# Patient Record
Sex: Male | Born: 1990
Health system: Southern US, Community
[De-identification: ages and names within clinical notes are randomized; demographics above are authoritative.]

## PROBLEM LIST (undated history)

## (undated) DIAGNOSIS — S42309A Unspecified fracture of shaft of humerus, unspecified arm, initial encounter for closed fracture: Secondary | ICD-10-CM

## (undated) DIAGNOSIS — S42009A Fracture of unspecified part of unspecified clavicle, initial encounter for closed fracture: Secondary | ICD-10-CM

## (undated) DIAGNOSIS — Z202 Contact with and (suspected) exposure to infections with a predominantly sexual mode of transmission: Secondary | ICD-10-CM

## (undated) DIAGNOSIS — J3089 Other allergic rhinitis: Secondary | ICD-10-CM

## (undated) HISTORY — DX: Unspecified fracture of shaft of humerus, unspecified arm, initial encounter for closed fracture: S42.309A

## (undated) HISTORY — PX: FRACTURE SURGERY: SHX138

## (undated) HISTORY — DX: Contact with and (suspected) exposure to infections with a predominantly sexual mode of transmission: Z20.2

---
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
## 2010-08-11 ENCOUNTER — Inpatient Hospital Stay (INDEPENDENT_AMBULATORY_CARE_PROVIDER_SITE_OTHER)
Admission: RE | Admit: 2010-08-11 | Discharge: 2010-08-11 | Disposition: A | Discharge status: Home / Self Care | Payer: Self-pay | Source: Ambulatory Visit | Attending: Emergency Medicine | Admitting: Emergency Medicine

## 2010-08-11 DIAGNOSIS — N342 Other urethritis: Secondary | ICD-10-CM

## 2010-08-11 LAB — POCT URINALYSIS DIP (DEVICE)
Bilirubin Urine: NEGATIVE
Glucose, UA: NEGATIVE mg/dL
Ketones, ur: NEGATIVE mg/dL
Nitrite: NEGATIVE
Protein, ur: 30 mg/dL — AB
Specific Gravity, Urine: >=1.03 (ref 1.005–1.030)
Urobilinogen, UA: 1 mg/dL (ref 0.0–1.0)
pH: 6 (ref 5.0–8.0)

## 2010-09-28 ENCOUNTER — Ambulatory Visit (INDEPENDENT_AMBULATORY_CARE_PROVIDER_SITE_OTHER): Payer: Self-pay | Admitting: Family Medicine

## 2010-09-28 ENCOUNTER — Encounter: Payer: Self-pay | Admitting: Family Medicine

## 2010-09-28 VITALS — BP 130/74 | HR 71 | Ht 74.5 in | Wt 174.0 lb

## 2010-09-28 DIAGNOSIS — Z049 Encounter for examination and observation for unspecified reason: Secondary | ICD-10-CM

## 2010-09-28 DIAGNOSIS — IMO0001 Reserved for inherently not codable concepts without codable children: Secondary | ICD-10-CM

## 2010-10-11 ENCOUNTER — Encounter: Payer: Self-pay | Admitting: Family Medicine

## 2010-10-11 NOTE — Progress Notes (Signed)
  Subjective:    Patient ID: Cody Callahan, male    DOB: [birth year], 20 y.o.   MRN: [id number]  HPI Patient here to establish care so that he can get project access card, no complaints or concerns today.  Recently treated for GC/Chlamydia at [facility], no further symptoms.     Review of Systems Denies headaches, gi problems, shortness of breath, chest pain, muscle or joint aches, dysuria    Objective:   Physical Exam  Constitutional: He appears well-developed and well-nourished. No distress.  HENT:  Head: Normocephalic and atraumatic.  Cardiovascular: Normal rate, regular rhythm and normal heart sounds.  Exam reveals no gallop and no friction rub.   No murmur heard. Pulmonary/Chest: Effort normal and breath sounds normal.  Abdominal: Soft. Bowel sounds are normal. He exhibits no distension. There is no tenderness. There is no rebound.  Skin: Skin is warm and dry. No rash noted.  Psychiatric: He has a normal mood and affect. His behavior is normal. Judgment and thought content normal.          Assessment & Plan:  Well adult, explained to him he should return for complete physical once he has [facility] card.

## 2011-03-29 ENCOUNTER — Encounter: Payer: Self-pay | Admitting: Family Medicine

## 2011-03-29 ENCOUNTER — Ambulatory Visit (INDEPENDENT_AMBULATORY_CARE_PROVIDER_SITE_OTHER): Payer: Self-pay | Admitting: Family Medicine

## 2011-03-29 DIAGNOSIS — J019 Acute sinusitis, unspecified: Secondary | ICD-10-CM | POA: Insufficient documentation

## 2011-03-29 MED ORDER — AMOXICILLIN 500 MG PO CAPS: 1000.0000 mg | ORAL_CAPSULE | Freq: Three times a day (TID) | ORAL | Status: AC

## 2011-03-29 NOTE — Patient Instructions (Addendum)
It was good seeing you today Take your antibiotics until they are all gone. You can continue OTC decongestants and cough suppressants.  Try mucinex to thin secretions.     You will likely start to feel better within a few days Call back if you feel like your symptoms are not improving.

## 2011-03-29 NOTE — Progress Notes (Signed)
  Subjective:    Patient ID: Cody Callahan, male    DOB: Jul 27, 1990, 20 y.o.   MRN: [id number]  Sinusitis Episode onset: ~2 weeks ago with worsening over the past 2 days.  Maximum temperature: Subjective fever without chills at home. The fever has been present for 1 to 2 days. The pain is moderate. Pertinent negatives include no chills or shortness of breath. (Symptoms include: Cough, congestion, headache, sinus pressure R>L, sore throat Tooth pain, Purulent nasal discharge with blood when blowing nose. ) Past treatments include oral decongestants and acetaminophen. The treatment provided mild relief.      Review of Systems  Constitutional: Negative for chills.  Respiratory: Negative for chest tightness, shortness of breath and wheezing.   Cardiovascular: Negative for chest pain.  Musculoskeletal: Negative for myalgias.       Objective:   Physical Exam  Constitutional: He appears well-developed and well-nourished. No distress.  HENT:  Head: Normocephalic and atraumatic.  Right Ear: Tympanic membrane and ear canal normal.  Left Ear: Tympanic membrane and ear canal normal.  Nose: Right sinus exhibits maxillary sinus tenderness and frontal sinus tenderness. Left sinus exhibits maxillary sinus tenderness. Left sinus exhibits no frontal sinus tenderness.  Mouth/Throat: Uvula is midline. Posterior oropharyngeal erythema present. No oropharyngeal exudate.  Cardiovascular: Normal rate, regular rhythm and normal heart sounds.   Pulmonary/Chest: Effort normal and breath sounds normal.  Lymphadenopathy:    He has no cervical adenopathy.  Neurological: He is alert.  Skin: Skin is warm and dry.          Assessment & Plan:

## 2011-06-23 ENCOUNTER — Emergency Department (INDEPENDENT_AMBULATORY_CARE_PROVIDER_SITE_OTHER): Payer: Self-pay

## 2011-06-23 ENCOUNTER — Encounter (HOSPITAL_COMMUNITY): Payer: Self-pay

## 2011-06-23 ENCOUNTER — Emergency Department (HOSPITAL_COMMUNITY)
Admission: EM | Admit: 2011-06-23 | Discharge: 2011-06-23 | Disposition: A | Discharge status: Home / Self Care | Payer: Self-pay | Source: Home / Self Care

## 2011-06-23 DIAGNOSIS — S62309A Unspecified fracture of unspecified metacarpal bone, initial encounter for closed fracture: Secondary | ICD-10-CM

## 2011-06-23 DIAGNOSIS — S62306A Unspecified fracture of fifth metacarpal bone, right hand, initial encounter for closed fracture: Secondary | ICD-10-CM

## 2011-06-23 NOTE — Progress Notes (Signed)
Orthopedic Tech Progress Note Patient Details:  Cody Callahan 1990-05-06 [id number]  Type of Splint: Other (comment) (ulnar gutter) Splint Location: right hand Splint Interventions: Application    Nikki Dom 06/23/2011, 3:37 PM
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
## 2011-06-23 NOTE — ED Provider Notes (Signed)
Medical screening examination/treatment/procedure(s) were performed by resident physician or non-physician practitioner and as supervising physician I was immediately available for consultation/collaboration.   Adeleine Pask DOUGLAS MD.    Sonika Levins D Sui Kasparek, MD 06/23/11 1936 
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
## 2011-06-23 NOTE — Discharge Instructions (Signed)
Wear the splint until you are seen by Dr Amanda Pea for follow up. Ice and elevate your hand to help with swelling. Tylenol or Ibuprofen as needed for discomfort.

## 2014-11-10 ENCOUNTER — Ambulatory Visit
Admission: RE | Admit: 2014-11-10 | Discharge: 2014-11-10 | Disposition: A | Discharge status: Home / Self Care | Payer: No Typology Code available for payment source | Source: Ambulatory Visit | Attending: Occupational Medicine | Admitting: Occupational Medicine

## 2014-11-10 ENCOUNTER — Other Ambulatory Visit: Payer: Self-pay | Admitting: Occupational Medicine

## 2014-11-10 DIAGNOSIS — Z021 Encounter for pre-employment examination: Secondary | ICD-10-CM

## 2017-08-14 DIAGNOSIS — D1801 Hemangioma of skin and subcutaneous tissue: Secondary | ICD-10-CM | POA: Diagnosis not present

## 2017-08-14 DIAGNOSIS — L814 Other melanin hyperpigmentation: Secondary | ICD-10-CM | POA: Diagnosis not present

## 2017-08-14 DIAGNOSIS — D225 Melanocytic nevi of trunk: Secondary | ICD-10-CM | POA: Diagnosis not present

## 2017-08-29 DIAGNOSIS — M9901 Segmental and somatic dysfunction of cervical region: Secondary | ICD-10-CM | POA: Diagnosis not present

## 2017-08-29 DIAGNOSIS — M542 Cervicalgia: Secondary | ICD-10-CM | POA: Diagnosis not present

## 2017-08-29 DIAGNOSIS — M9903 Segmental and somatic dysfunction of lumbar region: Secondary | ICD-10-CM | POA: Diagnosis not present

## 2017-09-04 DIAGNOSIS — M9901 Segmental and somatic dysfunction of cervical region: Secondary | ICD-10-CM | POA: Diagnosis not present

## 2017-09-04 DIAGNOSIS — M542 Cervicalgia: Secondary | ICD-10-CM | POA: Diagnosis not present

## 2017-09-04 DIAGNOSIS — M9903 Segmental and somatic dysfunction of lumbar region: Secondary | ICD-10-CM | POA: Diagnosis not present

## 2017-09-11 DIAGNOSIS — M542 Cervicalgia: Secondary | ICD-10-CM | POA: Diagnosis not present

## 2017-09-11 DIAGNOSIS — M9901 Segmental and somatic dysfunction of cervical region: Secondary | ICD-10-CM | POA: Diagnosis not present

## 2017-09-11 DIAGNOSIS — M9903 Segmental and somatic dysfunction of lumbar region: Secondary | ICD-10-CM | POA: Diagnosis not present

## 2017-09-13 DIAGNOSIS — M9903 Segmental and somatic dysfunction of lumbar region: Secondary | ICD-10-CM | POA: Diagnosis not present

## 2017-09-13 DIAGNOSIS — M9901 Segmental and somatic dysfunction of cervical region: Secondary | ICD-10-CM | POA: Diagnosis not present

## 2017-09-13 DIAGNOSIS — M542 Cervicalgia: Secondary | ICD-10-CM | POA: Diagnosis not present

## 2017-09-19 DIAGNOSIS — M542 Cervicalgia: Secondary | ICD-10-CM | POA: Diagnosis not present

## 2017-09-19 DIAGNOSIS — M9901 Segmental and somatic dysfunction of cervical region: Secondary | ICD-10-CM | POA: Diagnosis not present

## 2017-09-19 DIAGNOSIS — M9903 Segmental and somatic dysfunction of lumbar region: Secondary | ICD-10-CM | POA: Diagnosis not present

## 2017-10-01 DIAGNOSIS — M9901 Segmental and somatic dysfunction of cervical region: Secondary | ICD-10-CM | POA: Diagnosis not present

## 2017-10-01 DIAGNOSIS — M9903 Segmental and somatic dysfunction of lumbar region: Secondary | ICD-10-CM | POA: Diagnosis not present

## 2017-10-01 DIAGNOSIS — M542 Cervicalgia: Secondary | ICD-10-CM | POA: Diagnosis not present

## 2017-12-28 ENCOUNTER — Emergency Department (HOSPITAL_COMMUNITY)
Admission: EM | Admit: 2017-12-28 | Discharge: 2017-12-28 | Disposition: A | Discharge status: Home / Self Care | Payer: 59 | Attending: Emergency Medicine | Admitting: Emergency Medicine

## 2017-12-28 ENCOUNTER — Encounter (HOSPITAL_COMMUNITY): Payer: Self-pay | Admitting: Emergency Medicine

## 2017-12-28 ENCOUNTER — Emergency Department (HOSPITAL_COMMUNITY): Payer: 59

## 2017-12-28 DIAGNOSIS — Y9389 Activity, other specified: Secondary | ICD-10-CM | POA: Insufficient documentation

## 2017-12-28 DIAGNOSIS — Y92410 Unspecified street and highway as the place of occurrence of the external cause: Secondary | ICD-10-CM | POA: Diagnosis not present

## 2017-12-28 DIAGNOSIS — T07XXXA Unspecified multiple injuries, initial encounter: Secondary | ICD-10-CM | POA: Insufficient documentation

## 2017-12-28 DIAGNOSIS — S80812A Abrasion, left lower leg, initial encounter: Secondary | ICD-10-CM | POA: Diagnosis not present

## 2017-12-28 DIAGNOSIS — S4992XA Unspecified injury of left shoulder and upper arm, initial encounter: Secondary | ICD-10-CM | POA: Diagnosis present

## 2017-12-28 DIAGNOSIS — S42002A Fracture of unspecified part of left clavicle, initial encounter for closed fracture: Secondary | ICD-10-CM | POA: Diagnosis not present

## 2017-12-28 DIAGNOSIS — S50812A Abrasion of left forearm, initial encounter: Secondary | ICD-10-CM | POA: Diagnosis not present

## 2017-12-28 DIAGNOSIS — S299XXA Unspecified injury of thorax, initial encounter: Secondary | ICD-10-CM | POA: Diagnosis not present

## 2017-12-28 DIAGNOSIS — T148XXA Other injury of unspecified body region, initial encounter: Secondary | ICD-10-CM

## 2017-12-28 DIAGNOSIS — Y999 Unspecified external cause status: Secondary | ICD-10-CM | POA: Insufficient documentation

## 2017-12-28 DIAGNOSIS — S42022A Displaced fracture of shaft of left clavicle, initial encounter for closed fracture: Secondary | ICD-10-CM

## 2017-12-28 MED ORDER — OXYCODONE-ACETAMINOPHEN 5-325 MG PO TABS: 1.0000 | ORAL_TABLET | ORAL | 0 refills | Status: DC | PRN

## 2017-12-28 MED ORDER — OXYCODONE-ACETAMINOPHEN 5-325 MG PO TABS
2.0000 | ORAL_TABLET | Freq: Once | ORAL | Status: AC
Start: 2017-12-28 — End: 2017-12-28
  Administered 2017-12-28: 2 via ORAL
  Filled 2017-12-28: qty 2

## 2017-12-28 MED ORDER — MORPHINE SULFATE (PF) 4 MG/ML IV SOLN
4.0000 mg | Freq: Once | INTRAVENOUS | Status: AC
  Administered 2017-12-28: 4 mg via INTRAVENOUS
  Filled 2017-12-28: qty 1
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
## 2017-12-30 DIAGNOSIS — S42002A Fracture of unspecified part of left clavicle, initial encounter for closed fracture: Secondary | ICD-10-CM | POA: Diagnosis not present

## 2018-01-01 ENCOUNTER — Other Ambulatory Visit: Payer: Self-pay

## 2018-01-01 ENCOUNTER — Encounter (HOSPITAL_COMMUNITY): Payer: Self-pay | Admitting: *Deleted

## 2018-01-01 NOTE — Progress Notes (Signed)
Pt denies SOB, chest pain, and being under the care of a cardiologist. 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 Pt denies having a 12-lead EKG and chest x ray within the last year. Pt denies recent labs. Pt made aware to stop taking vitamins, fish oil and herbal medications. Do not take any NSAIDs ie: Ibuprofen, Advil, Naproxen (Aleve), Motrin, BC and Goody Powder. Pt verbalized understanding of all pre-op instructions.

## 2018-01-02 ENCOUNTER — Ambulatory Visit (HOSPITAL_COMMUNITY): Payer: 59

## 2018-01-02 ENCOUNTER — Ambulatory Visit (HOSPITAL_COMMUNITY): Payer: 59 | Admitting: Anesthesiology

## 2018-01-02 ENCOUNTER — Ambulatory Visit (HOSPITAL_COMMUNITY)
Admission: RE | Admit: 2018-01-02 | Discharge: 2018-01-02 | Disposition: A | Discharge status: Home / Self Care | Payer: 59 | Source: Ambulatory Visit | Attending: Orthopedic Surgery | Admitting: Orthopedic Surgery

## 2018-01-02 ENCOUNTER — Encounter (HOSPITAL_COMMUNITY): Payer: Self-pay | Admitting: Urology

## 2018-01-02 ENCOUNTER — Encounter (HOSPITAL_COMMUNITY)
Admission: RE | Disposition: A | Discharge status: Home / Self Care | Payer: Self-pay | Source: Ambulatory Visit | Attending: Orthopedic Surgery

## 2018-01-02 DIAGNOSIS — Z8249 Family history of ischemic heart disease and other diseases of the circulatory system: Secondary | ICD-10-CM | POA: Insufficient documentation

## 2018-01-02 DIAGNOSIS — Z803 Family history of malignant neoplasm of breast: Secondary | ICD-10-CM | POA: Insufficient documentation

## 2018-01-02 DIAGNOSIS — S42022A Displaced fracture of shaft of left clavicle, initial encounter for closed fracture: Secondary | ICD-10-CM | POA: Diagnosis not present

## 2018-01-02 DIAGNOSIS — Z833 Family history of diabetes mellitus: Secondary | ICD-10-CM | POA: Insufficient documentation

## 2018-01-02 DIAGNOSIS — Z79899 Other long term (current) drug therapy: Secondary | ICD-10-CM | POA: Insufficient documentation

## 2018-01-02 DIAGNOSIS — Z87891 Personal history of nicotine dependence: Secondary | ICD-10-CM | POA: Insufficient documentation

## 2018-01-02 DIAGNOSIS — Z8379 Family history of other diseases of the digestive system: Secondary | ICD-10-CM | POA: Diagnosis not present

## 2018-01-02 DIAGNOSIS — S42002D Fracture of unspecified part of left clavicle, subsequent encounter for fracture with routine healing: Secondary | ICD-10-CM | POA: Diagnosis not present

## 2018-01-02 HISTORY — DX: Other allergic rhinitis: J30.89

## 2018-01-02 HISTORY — PX: ORIF CLAVICULAR FRACTURE: SHX5055

## 2018-01-02 HISTORY — DX: Fracture of unspecified part of unspecified clavicle, initial encounter for closed fracture: S42.009A

## 2018-01-02 SURGERY — OPEN REDUCTION INTERNAL FIXATION (ORIF) CLAVICULAR FRACTURE
Anesthesia: General | Site: Shoulder | Laterality: Left

## 2018-01-02 MED ORDER — DEXAMETHASONE SODIUM PHOSPHATE 10 MG/ML IJ SOLN
INTRAMUSCULAR | Status: AC
  Filled 2018-01-02: qty 5

## 2018-01-02 MED ORDER — MIDAZOLAM HCL 5 MG/5ML IJ SOLN
INTRAMUSCULAR | Status: DC | PRN
  Administered 2018-01-02: 2 mg via INTRAVENOUS

## 2018-01-02 MED ORDER — FENTANYL CITRATE (PF) 100 MCG/2ML IJ SOLN
INTRAMUSCULAR | Status: AC
  Administered 2018-01-02: 50 ug via INTRAVENOUS
  Filled 2018-01-02: qty 2

## 2018-01-02 MED ORDER — LACTATED RINGERS IV SOLN: INTRAVENOUS | Status: DC

## 2018-01-02 MED ORDER — ONDANSETRON HCL 4 MG/2ML IJ SOLN
INTRAMUSCULAR | Status: AC
  Filled 2018-01-02: qty 8

## 2018-01-02 MED ORDER — OXYCODONE-ACETAMINOPHEN 5-325 MG PO TABS: 1.0000 | ORAL_TABLET | ORAL | 0 refills | Status: DC | PRN

## 2018-01-02 MED ORDER — PROPOFOL 10 MG/ML IV BOLUS
INTRAVENOUS | Status: AC
  Filled 2018-01-02: qty 20

## 2018-01-02 MED ORDER — HYDROMORPHONE HCL 1 MG/ML IJ SOLN: 0.2500 mg | INTRAMUSCULAR | Status: DC | PRN

## 2018-01-02 MED ORDER — MIDAZOLAM HCL 2 MG/2ML IJ SOLN
INTRAMUSCULAR | Status: AC
  Filled 2018-01-02: qty 2

## 2018-01-02 MED ORDER — FENTANYL CITRATE (PF) 100 MCG/2ML IJ SOLN
50.0000 ug | Freq: Once | INTRAMUSCULAR | Status: AC
  Administered 2018-01-02: 50 ug via INTRAVENOUS
  Filled 2018-01-02: qty 1

## 2018-01-02 MED ORDER — PROPOFOL 10 MG/ML IV BOLUS
INTRAVENOUS | Status: DC | PRN
  Administered 2018-01-02: 150 mg via INTRAVENOUS

## 2018-01-02 MED ORDER — ONDANSETRON HCL 4 MG/2ML IJ SOLN
INTRAMUSCULAR | Status: DC | PRN
  Administered 2018-01-02: 4 mg via INTRAVENOUS

## 2018-01-02 MED ORDER — BUPIVACAINE-EPINEPHRINE (PF) 0.5% -1:200000 IJ SOLN
INTRAMUSCULAR | Status: DC | PRN
  Administered 2018-01-02: 15 mL via PERINEURAL

## 2018-01-02 MED ORDER — PROMETHAZINE HCL 25 MG/ML IJ SOLN: 6.2500 mg | INTRAMUSCULAR | Status: DC | PRN

## 2018-01-02 MED ORDER — PHENYLEPHRINE 40 MCG/ML (10ML) SYRINGE FOR IV PUSH (FOR BLOOD PRESSURE SUPPORT)
PREFILLED_SYRINGE | INTRAVENOUS | Status: AC
  Filled 2018-01-02: qty 40

## 2018-01-02 MED ORDER — MIDAZOLAM HCL 2 MG/2ML IJ SOLN
INTRAMUSCULAR | Status: AC
  Administered 2018-01-02: 2 mg via INTRAVENOUS
  Filled 2018-01-02: qty 2

## 2018-01-02 MED ORDER — MEPERIDINE HCL 50 MG/ML IJ SOLN: 6.2500 mg | INTRAMUSCULAR | Status: DC | PRN

## 2018-01-02 MED ORDER — CHLORHEXIDINE GLUCONATE 4 % EX LIQD: 60.0000 mL | Freq: Once | CUTANEOUS | Status: DC

## 2018-01-02 MED ORDER — EPHEDRINE 5 MG/ML INJ
INTRAVENOUS | Status: AC
  Filled 2018-01-02: qty 10

## 2018-01-02 MED ORDER — CEFAZOLIN SODIUM-DEXTROSE 2-4 GM/100ML-% IV SOLN
2.0000 g | INTRAVENOUS | Status: AC
  Administered 2018-01-02: 2 g via INTRAVENOUS
  Filled 2018-01-02: qty 100

## 2018-01-02 MED ORDER — LIDOCAINE 2% (20 MG/ML) 5 ML SYRINGE
INTRAMUSCULAR | Status: AC
  Filled 2018-01-02: qty 20

## 2018-01-02 MED ORDER — ROCURONIUM BROMIDE 100 MG/10ML IV SOLN
INTRAVENOUS | Status: DC | PRN
  Administered 2018-01-02: 50 mg via INTRAVENOUS

## 2018-01-02 MED ORDER — FENTANYL CITRATE (PF) 250 MCG/5ML IJ SOLN
INTRAMUSCULAR | Status: AC
  Filled 2018-01-02: qty 5

## 2018-01-02 MED ORDER — SUGAMMADEX SODIUM 200 MG/2ML IV SOLN
INTRAVENOUS | Status: DC | PRN
  Administered 2018-01-02: 300 mg via INTRAVENOUS

## 2018-01-02 MED ORDER — ONDANSETRON HCL 4 MG PO TABS: 4.0000 mg | ORAL_TABLET | Freq: Three times a day (TID) | ORAL | 0 refills | Status: DC | PRN

## 2018-01-02 MED ORDER — CYCLOBENZAPRINE HCL 10 MG PO TABS: 10.0000 mg | ORAL_TABLET | Freq: Three times a day (TID) | ORAL | 1 refills | Status: DC | PRN

## 2018-01-02 MED ORDER — BUPIVACAINE-EPINEPHRINE 0.5% -1:200000 IJ SOLN
INTRAMUSCULAR | Status: AC
  Filled 2018-01-02: qty 1

## 2018-01-02 MED ORDER — MIDAZOLAM HCL 2 MG/2ML IJ SOLN
2.0000 mg | Freq: Once | INTRAMUSCULAR | Status: AC
  Administered 2018-01-02: 2 mg via INTRAVENOUS
  Filled 2018-01-02: qty 2

## 2018-01-02 MED ORDER — SUCCINYLCHOLINE CHLORIDE 200 MG/10ML IV SOSY
PREFILLED_SYRINGE | INTRAVENOUS | Status: AC
  Filled 2018-01-02: qty 10

## 2018-01-02 MED ORDER — FENTANYL CITRATE (PF) 100 MCG/2ML IJ SOLN
INTRAMUSCULAR | Status: DC | PRN
  Administered 2018-01-02: 150 ug via INTRAVENOUS
  Administered 2018-01-02: 100 ug via INTRAVENOUS

## 2018-01-02 MED ORDER — 0.9 % SODIUM CHLORIDE (POUR BTL) OPTIME
TOPICAL | Status: DC | PRN
  Administered 2018-01-02: 1000 mL

## 2018-01-02 MED ORDER — ROCURONIUM BROMIDE 50 MG/5ML IV SOSY
PREFILLED_SYRINGE | INTRAVENOUS | Status: AC
  Filled 2018-01-02: qty 15

## 2018-01-02 MED ORDER — LACTATED RINGERS IV SOLN
INTRAVENOUS | Status: DC
  Administered 2018-01-02 (×3): via INTRAVENOUS

## 2018-01-02 MED ORDER — LIDOCAINE HCL (CARDIAC) PF 100 MG/5ML IV SOSY
PREFILLED_SYRINGE | INTRAVENOUS | Status: DC | PRN
  Administered 2018-01-02: 100 mg via INTRAVENOUS

## 2018-01-02 SURGICAL SUPPLY — 54 items
ADH SKN CLS APL DERMABOND .7 (GAUZE/BANDAGES/DRESSINGS) ×1
AID PSTN UNV HD RSTRNT DISP (MISCELLANEOUS) ×1
BIT DRILL 2.8X5 QR DISP (BIT) ×1 IMPLANT
COVER SURGICAL LIGHT HANDLE (MISCELLANEOUS) ×2 IMPLANT
DERMABOND ADVANCED (GAUZE/BANDAGES/DRESSINGS) ×1
DERMABOND ADVANCED .7 DNX12 (GAUZE/BANDAGES/DRESSINGS) ×1 IMPLANT
DRAPE C-ARM 42X72 X-RAY (DRAPES) ×2 IMPLANT
DRAPE ORTHO SPLIT 77X108 STRL (DRAPES) ×4
DRAPE SURG 17X23 STRL (DRAPES) ×1 IMPLANT
DRAPE SURG ORHT 6 SPLT 77X108 (DRAPES) ×2 IMPLANT
DRAPE U-SHAPE 47X51 STRL (DRAPES) ×4 IMPLANT
DRSG MEPILEX BORDER 4X8 (GAUZE/BANDAGES/DRESSINGS) ×2 IMPLANT
DURAPREP 26ML APPLICATOR (WOUND CARE) ×2 IMPLANT
ELECT CAUTERY BLADE 6.4 (BLADE) ×2 IMPLANT
ELECT REM PT RETURN 9FT ADLT (ELECTROSURGICAL) ×2
ELECTRODE REM PT RTRN 9FT ADLT (ELECTROSURGICAL) ×1 IMPLANT
GLOVE BIO SURGEON STRL SZ7.5 (GLOVE) ×2 IMPLANT
GLOVE BIO SURGEON STRL SZ8 (GLOVE) ×3 IMPLANT
GLOVE EUDERMIC 7 POWDERFREE (GLOVE) ×2 IMPLANT
GLOVE SS BIOGEL STRL SZ 7.5 (GLOVE) ×1 IMPLANT
GLOVE SUPERSENSE BIOGEL SZ 7.5 (GLOVE) ×2
GOWN STRL REUS W/ TWL LRG LVL3 (GOWN DISPOSABLE) ×1 IMPLANT
GOWN STRL REUS W/ TWL XL LVL3 (GOWN DISPOSABLE) ×2 IMPLANT
GOWN STRL REUS W/TWL LRG LVL3 (GOWN DISPOSABLE) ×4
GOWN STRL REUS W/TWL XL LVL3 (GOWN DISPOSABLE) ×4
KIT BASIN OR (CUSTOM PROCEDURE TRAY) ×2 IMPLANT
KIT TURNOVER KIT B (KITS) ×2 IMPLANT
MANIFOLD NEPTUNE II (INSTRUMENTS) ×2 IMPLANT
NDL HYPO 25GX1X1/2 BEV (NEEDLE) ×1 IMPLANT
NEEDLE HYPO 25GX1X1/2 BEV (NEEDLE) ×2 IMPLANT
NS IRRIG 1000ML POUR BTL (IV SOLUTION) ×2 IMPLANT
PACK SHOULDER (CUSTOM PROCEDURE TRAY) ×2 IMPLANT
PAD ARMBOARD 7.5X6 YLW CONV (MISCELLANEOUS) ×3 IMPLANT
PLATE CLAVICLE 8 HOLE LEFT (Plate) ×1 IMPLANT
RESTRAINT HEAD UNIVERSAL NS (MISCELLANEOUS) ×2 IMPLANT
SCREW LOCK 12X3.5X HEXALOBE (Screw) IMPLANT
SCREW LOCKING 3.5X10MM (Screw) ×2 IMPLANT
SCREW LOCKING 3.5X12 (Screw) ×4 IMPLANT
SCREW NON LOCK 3.5X10MM (Screw) ×1 IMPLANT
SCREW NONLOCK HEX 3.5X12 (Screw) ×1 IMPLANT
SLING ARM FOAM STRAP LRG (SOFTGOODS) ×1 IMPLANT
SLING ARM IMMOBILIZER XL (CAST SUPPLIES) ×1 IMPLANT
SPONGE LAP 18X18 X RAY DECT (DISPOSABLE) ×4 IMPLANT
SPONGE LAP 4X18 RFD (DISPOSABLE) ×3 IMPLANT
SUCTION FRAZIER HANDLE 10FR (MISCELLANEOUS) ×1
SUCTION TUBE FRAZIER 10FR DISP (MISCELLANEOUS) ×1 IMPLANT
SUT MNCRL AB 3-0 PS2 18 (SUTURE) ×2 IMPLANT
SUT MON AB 2-0 CT1 27 (SUTURE) ×2 IMPLANT
SUT VIC AB 1 CT1 27 (SUTURE) ×4
SUT VIC AB 1 CT1 27XBRD ANBCTR (SUTURE) ×1 IMPLANT
SYR CONTROL 10ML LL (SYRINGE) ×2 IMPLANT
TOWEL OR 17X24 6PK STRL BLUE (TOWEL DISPOSABLE) ×1 IMPLANT
TOWEL OR 17X26 10 PK STRL BLUE (TOWEL DISPOSABLE) ×2 IMPLANT
YANKAUER SUCT BULB TIP NO VENT (SUCTIONS) ×2 IMPLANT

## 2018-01-02 NOTE — Discharge Instructions (Addendum)
Cody Callahan. Supple, M.D., F.A.A.O.S. Orthopaedic Surgery   Clavicle Fracture A clavicle fracture is a broken collarbone. The collarbone is the long bone that connects your shoulder to your chest wall. A broken collarbone may be treated with a sling or with surgery. Treatment depends on whether the broken ends of the bone are out of place or not. Follow these instructions at home: If you have a sling:  Wear the sling as told by your doctor. Take it off only as told by your doctor.  Loosen the sling if your fingers tingle, become numb, or turn cold and blue.  Do not lift your arm. Keep it across your chest.  Keep the sling clean.  Ask your doctor if you may take off the sling for bathing. ? If your sling is not waterproof, do not let it get wet. Cover the sling with a watertight covering if you take a bath or a shower while wearing it. ? If you may take off your sling when you take a bath or a shower, keep your shoulder in the same position as when the sling is on. Managing pain, stiffness, and swelling  If told, put ice on the injured area: ? If you have a removable sling, take it off as told by your doctor. ? Put ice in a plastic bag. ? Place a towel between your skin and the bag. ? Leave the ice on for 20 minutes, 2-3 times a day. Activity  Avoid activities that make your symptoms worse for 4-6 weeks, or as long as told.  Ask your doctor when it is safe for you to drive.  Do exercises as told by your doctor. General instructions  Do not use any products that contain nicotine or tobacco, such as cigarettes and e-cigarettes. These can delay bone healing. If you need help quitting, ask your doctor.  Take over-the-counter and prescription medicines only as told by your doctor.  Keep all follow-up visits as told by your doctor. This is important. Contact a doctor if:  Your medicine is not making you feel less pain.  Your medicine is not making swelling better. Get help  right away if:  Your cannot feel your arm (your arm is numb).  Your arm is cold.  Your arm is a lighter color than normal. Summary  A clavicle fracture is a broken collarbone. The collarbone is the long bone that connects your shoulder to your chest wall.  Treatment depends on whether the broken ends of the bone are out of place or not.  If you have a sling, wear it as told by your doctor.  Do exercises when your doctor says you can. The exercises will help your arm get strong and move like it used to. This information is not intended to replace advice given to you by your health care provider. Make sure you discuss any questions you have with your health care provider. Document Released: 09/12/2007 Document Revised: 02/13/2016 Document Reviewed: 02/13/2016 Elsevier Interactive Patient Education  2017 [facility] Arthroscopic [facility] Reconstructive Surgery of the Shoulder and Knee [phone] [address] [place], [place] [address] - Fax [phone]   POST-OP  INSTRUCTIONS  1. Call the office at [phone] to schedule your first post-op appointment 7-10 days from the date of your surgery.  2. You may remove the current dressing on day 3 and shower. Leave the dermabond(clear glue like substance) across incision.   3. Wear your sling/immobilizer at all times except  to perform the exercises below or to occasionally let your arm dangle by your side to stretch your elbow. You also need to sleep in your sling immobilizer until instructed otherwise.  4. Range of motion to your elbow, wrist, and hand are encouraged 3-5 times daily. Exercise to your hand and fingers helps to reduce swelling you may experience.  5. Utilize ice to the shoulder 3-5 times minimum a day and additionally if you are experiencing pain.  6. Prescriptions for a pain medication and a muscle relaxant are provided for you. It is recommended that if you are experiencing pain that you  pain medication alone is not controlling, add the muscle relaxant along with the pain medication which can give additional pain relief. The first 1-2 days is generally the most severe of your pain and then should gradually decrease. As your pain lessens it is recommended that you decrease your use of the pain medications to an "as needed basis'" only and to always comply with the recommended dosages of the pain medications.  7. Pain medications can produce constipation along with their use. If you experience this, the use of an over the counter stool softener or laxative daily is recommended.   8. For additional questions or concerns, please do not hesitate to call the office. If after hours there is an answering service to forward your concerns to the physician on call.  9.Pain control following an exparel block  To help control your post-operative pain you received a nerve block  performed with Exparel which is a long acting anesthetic (numbing agent) which can provide pain relief and sensations of numbness (and relief of pain) in the operative shoulder and arm for up to 3 days. Sometimes it provides mixed relief, meaning you may still have numbness in certain areas of the arm but can still be able to move  parts of that arm, hand, and fingers. We recommend that your prescribed pain medications  be used as needed. We do not feel it is necessary to "pre medicate" and "stay ahead" of pain.  Taking narcotic pain medications when you are not having any pain can lead to unnecessary and potentially dangerous side effects.   POST-OP EXERCISES  Pendulum Exercises  Perform pendulum exercises while standing and bending at the waist. Support your uninvolved arm on a table or chair and allow your operated arm to hang freely. Make sure to do these exercises passively - not using you shoulder muscles.  Repeat 20 times. Do 3 sessions per day.

## 2018-01-02 NOTE — Anesthesia Procedure Notes (Signed)
Procedure Name: Intubation Date/Time: 01/02/2018 3:49 PM Performed by: Lizmarie Witters T, CRNA Pre-anesthesia Checklist: Patient identified, Emergency Drugs available, Suction available and Patient being monitored Patient Re-evaluated:Patient Re-evaluated prior to induction Oxygen Delivery Method: Circle system utilized Preoxygenation: Pre-oxygenation with 100% oxygen Induction Type: IV induction Ventilation: Mask ventilation without difficulty Laryngoscope Size: Miller and 3 Grade View: Grade I Tube type: Oral Tube size: 7.5 mm Number of attempts: 1 Airway Equipment and Method: Patient positioned with wedge pillow and Stylet Placement Confirmation: ETT inserted through vocal cords under direct vision,  positive ETCO2 and breath sounds checked- equal and bilateral Secured at: 23 cm Tube secured with: Tape Dental Injury: Teeth and Oropharynx as per pre-operative assessment
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
## 2018-01-02 NOTE — Anesthesia Preprocedure Evaluation (Addendum)
Anesthesia Evaluation  Patient identified by MRN, date of birth, ID band Patient awake    Reviewed: Allergy & Precautions, NPO status , Patient's Chart, lab work & pertinent test results  Airway Mallampati: I  TM Distance: >3 FB Neck ROM: Full    Dental  (+) Teeth Intact, Dental Advisory Given   Pulmonary former smoker,    breath sounds clear to auscultation       Cardiovascular  Rhythm:Regular Rate:Normal     Neuro/Psych negative neurological ROS  negative psych ROS   GI/Hepatic negative GI ROS, Neg liver ROS,   Endo/Other  negative endocrine ROS  Renal/GU negative Renal ROS  negative genitourinary   Musculoskeletal negative musculoskeletal ROS (+)   Abdominal Normal abdominal exam  (+)   Peds  Hematology negative hematology ROS (+)   Anesthesia Other Findings   Reproductive/Obstetrics                            Anesthesia Physical Anesthesia Plan  ASA: II  Anesthesia Plan: General   Post-op Pain Management:    Induction: Intravenous  PONV Risk Score and Plan: 3 and Ondansetron, Dexamethasone and Midazolam  Airway Management Planned: Oral ETT  Additional Equipment: None  Intra-op Plan:   Post-operative Plan: Extubation in OR  Informed Consent: I have reviewed the patients History and Physical, chart, labs and discussed the procedure including the risks, benefits and alternatives for the proposed anesthesia with the patient or authorized representative who has indicated his/her understanding and acceptance.   Dental advisory given  Plan Discussed with: CRNA  Anesthesia Plan Comments:         Anesthesia Quick Evaluation

## 2018-01-02 NOTE — Anesthesia Procedure Notes (Signed)
Anesthesia Regional Block: Interscalene brachial plexus block   Pre-Anesthetic Checklist: ,, timeout performed, Correct Patient, Correct Site, Correct Laterality, Correct Procedure, Correct Position, site marked, Risks and benefits discussed,  Surgical consent,  Pre-op evaluation,  At surgeon's request and post-op pain management  Laterality: Left  Prep: chloraprep       Needles:  Injection technique: Single-shot  Needle Type: Echogenic Stimulator Needle     Needle Length: 9cm  Needle Gauge: 21     Additional Needles:   Procedures:,,,, ultrasound used (permanent image in chart),,,,  Narrative:  Start time: 01/02/2018 3:25 PM End time: 01/02/2018 3:32 PM Injection made incrementally with aspirations every 5 mL.  Performed by: Personally  Anesthesiologist: Effie Berkshire, MD  Additional Notes: Patient tolerated the procedure well. Local anesthetic introduced in an incremental fashion under minimal resistance after negative aspirations. No paresthesias were elicited. After completion of the procedure, no acute issues were identified and patient continued to be monitored by RN.

## 2018-01-02 NOTE — Transfer of Care (Signed)
Immediate Anesthesia Transfer of Care Note  Patient: Cody Callahan  Procedure(s) Performed: OPEN REDUCTION INTERNAL FIXATION (ORIF) left clavicle fracture (Left Shoulder)  Patient Location: PACU  Anesthesia Type:GA combined with regional for post-op pain  Level of Consciousness: awake, alert  and oriented  Airway & Oxygen Therapy: Patient Spontanous Breathing and Patient connected to nasal cannula oxygen  Post-op Assessment: Report given to RN, Post -op Vital signs reviewed and stable and Patient moving all extremities  Post vital signs: Reviewed and stable  Last Vitals:  Vitals Value Taken Time  BP 148/86 01/02/2018  5:31 PM  Temp    Pulse 107 01/02/2018  5:35 PM  Resp 17 01/02/2018  5:35 PM  SpO2 98 % 01/02/2018  5:35 PM  Vitals shown include unvalidated device data.  Last Pain:  Vitals:   01/02/18 1730  TempSrc:   PainSc: (P) Asleep      Patients Stated Pain Goal: 0 (43/83/81 [id number])  Complications: No apparent anesthesia complications
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
## 2018-01-03 ENCOUNTER — Encounter (HOSPITAL_COMMUNITY): Payer: Self-pay | Admitting: Orthopedic Surgery

## 2018-01-03 NOTE — Anesthesia Postprocedure Evaluation (Signed)
Anesthesia Post Note  Patient: Cody Callahan  Procedure(s) Performed: OPEN REDUCTION INTERNAL FIXATION (ORIF) left clavicle fracture (Left Shoulder)     Patient location during evaluation: PACU Anesthesia Type: General Level of consciousness: awake and alert Pain management: pain level controlled Vital Signs Assessment: post-procedure vital signs reviewed and stable Respiratory status: spontaneous breathing, nonlabored ventilation, respiratory function stable and patient connected to nasal cannula oxygen Cardiovascular status: blood pressure returned to baseline and stable Postop Assessment: no apparent nausea or vomiting Anesthetic complications: no    Last Vitals:  Vitals:   01/02/18 1800 01/02/18 1830  BP: 139/75   Pulse: 82   Resp: 13   Temp: 36.6 C 36.6 C  SpO2: 98%     Last Pain:  Vitals:   01/02/18 1815  TempSrc:   PainSc: 0-No pain                 Effie Berkshire

## 2018-01-13 DIAGNOSIS — Z5189 Encounter for other specified aftercare: Secondary | ICD-10-CM | POA: Diagnosis not present

## 2018-01-21 DIAGNOSIS — J069 Acute upper respiratory infection, unspecified: Secondary | ICD-10-CM | POA: Diagnosis not present

## 2018-01-21 DIAGNOSIS — R509 Fever, unspecified: Secondary | ICD-10-CM | POA: Diagnosis not present
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
## 2020-05-05 ENCOUNTER — Emergency Department (HOSPITAL_COMMUNITY)
Admission: EM | Admit: 2020-05-05 | Discharge: 2020-05-05 | Disposition: A | Discharge status: Home / Self Care | Payer: 59 | Attending: Emergency Medicine | Admitting: Emergency Medicine

## 2020-05-05 ENCOUNTER — Other Ambulatory Visit: Payer: Self-pay

## 2020-05-05 DIAGNOSIS — T886XXA Anaphylactic reaction due to adverse effect of correct drug or medicament properly administered, initial encounter: Secondary | ICD-10-CM | POA: Diagnosis not present

## 2020-05-05 DIAGNOSIS — Z87891 Personal history of nicotine dependence: Secondary | ICD-10-CM | POA: Diagnosis not present

## 2020-05-05 DIAGNOSIS — R220 Localized swelling, mass and lump, head: Secondary | ICD-10-CM | POA: Diagnosis present

## 2020-05-05 DIAGNOSIS — L509 Urticaria, unspecified: Secondary | ICD-10-CM | POA: Insufficient documentation

## 2020-05-05 DIAGNOSIS — T782XXA Anaphylactic shock, unspecified, initial encounter: Secondary | ICD-10-CM

## 2020-05-05 MED ORDER — EPINEPHRINE 0.3 MG/0.3ML IJ SOAJ: 0.3000 mg | INTRAMUSCULAR | 0 refills | Status: DC | PRN

## 2020-05-05 MED ORDER — METHYLPREDNISOLONE SODIUM SUCC 125 MG IJ SOLR
125.0000 mg | Freq: Once | INTRAMUSCULAR | Status: AC
  Administered 2020-05-05: 125 mg via INTRAVENOUS
  Filled 2020-05-05: qty 2

## 2020-05-05 MED ORDER — ACETAMINOPHEN 325 MG PO TABS
650.0000 mg | ORAL_TABLET | Freq: Once | ORAL | Status: AC
  Administered 2020-05-05: 650 mg via ORAL
  Filled 2020-05-05: qty 2

## 2020-05-05 MED ORDER — FAMOTIDINE IN NACL 20-0.9 MG/50ML-% IV SOLN
20.0000 mg | Freq: Once | INTRAVENOUS | Status: AC
  Administered 2020-05-05: 20 mg via INTRAVENOUS
  Filled 2020-05-05: qty 50

## 2020-05-05 MED ORDER — PREDNISONE 20 MG PO TABS: 40.0000 mg | ORAL_TABLET | Freq: Every day | ORAL | 0 refills | Status: AC

## 2020-05-05 NOTE — ED Triage Notes (Signed)
Pt here form home for allergic reaction. Had surgery for septal deviation this morning, took oxycodone and amoxicillin around lunch time and shortly thereafter developed hives, itching, oral swelling. EMS came and gave an epi pen and pt took some benadryl at home with improvement. Pt speaking in complete sentences without shortness of breath in triage. Reports taking both of those medications before without issue. Also had scopolamine patch prior to surgery today.

## 2020-05-05 NOTE — Discharge Instructions (Signed)
Today I spoke with Dr. Benjamine Mola. Please stop taking the antibiotic and the pain medicine as it is unclear which one caused your allergic reaction or if it was something else.  You may take Tylenol as needed for pain. Per Dr.Teoh do not take ibuprofen, Aleve, naproxen or other NSAID medications as this will make you bleed more.  Today you were seen and evaluated for an allergic reaction.  You were given multiple medications in the emergency room today.  The benadryl that you took can make you sleepy. Do not drive, operate heavy machinery or perform other potentially dangerous tasks while taking these medications.  If you develop any concerning symptoms, especially feeling like your throat is closing, shortness of breath, swelling of the face, lips or tongue then please return to the emergency room immediately for evaluation.    You have also been given a prescription for steroids.  Please take your first dose about 24 hours after you were given steroids in the emergency room.  Steroids may give you the feeling of being hot, increased appetite, and extra energy.    I have given you a prescription for two epi pens.  Please make sure you keep one with you at all times.  Please consider keeping two pills of benadryl with your epi pen and if you use it take the benadryl and call 9-11.  Please do not store epi-pens in your car or allow them to get very hot or cold as this can make them not work as well.

## 2020-05-05 NOTE — ED Provider Notes (Signed)
[facility] EMERGENCY DEPARTMENT Provider Note   CSN: [id number] Arrival date & time: 05/05/20  1458     History Chief Complaint  Patient presents with  . Allergic Reaction    Cody Callahan is a 30 y.o. male who presents today for evaluation of allergic reaction.  He had septal surgery this morning with Dr. Benjamine Mola and was discharged.  He states that he took oxycodone and amoxicillin around lunchtime and then shortly after developed hives, oral swelling including feelings of throat closing and called 911.  He took 50 mg p.o. Benadryl at home.  This happened at about 1 PM.  He reports that he got an EpiPen from EMS and no other medications.  He states that currently he is still having some lip swelling and his rash is improved however still present.  He states that he has had oxycodone and amoxicillin before.  He did have a scopolamine patch placed prior to surgery today.   States that he did eat lunch however denies any new foods or substances. HPI     Past Medical History:  Diagnosis Date  . Broken arm   . Clavicle fracture    left  . Environmental and seasonal allergies   . Gonorrhea contact, treated     Patient Active Problem List   Diagnosis Date Noted  . Acute sinusitis 03/29/2011    Past Surgical History:  Procedure Laterality Date  . FRACTURE SURGERY     RUE  . ORIF CLAVICULAR FRACTURE Left 01/02/2018   Procedure: OPEN REDUCTION INTERNAL FIXATION (ORIF) left clavicle fracture;  Surgeon: Justice Britain, MD;  Location: [facility];  Service: Orthopedics;  Laterality: Left;  130min       Family History  Problem Relation Age of Onset  . Crohn's disease Sister 63  . Breast cancer Mother 54  . Heart disease Mother 76  . Diabetes Mother 31    Social History   Tobacco Use  . Smoking status: Former Smoker    Types: Pipe, Landscape architect  . Smokeless tobacco: Never Used  Vaping Use  . Vaping Use: Never used  Substance Use Topics  . Alcohol use: Yes     Comment: social  . Drug use: No    Home Medications Prior to Admission medications   Medication Sig Start Date End Date Taking? Authorizing Provider  EPINEPHrine 0.3 mg/0.3 mL IJ SOAJ injection Inject 0.3 mg into the muscle as needed for anaphylaxis. 05/05/20  Yes Lorin Glass, PA-C  predniSONE (DELTASONE) 20 MG tablet Take 2 tablets (40 mg total) by mouth daily for 3 days. 05/05/20 05/08/20 Yes Lorin Glass, PA-C  cyclobenzaprine (FLEXERIL) 10 MG tablet Take 1 tablet (10 mg total) by mouth 3 (three) times daily as needed for muscle spasms. 01/02/18   Shuford, Olivia Mackie, PA-C  ibuprofen (ADVIL,MOTRIN) 200 MG tablet Take 400 mg by mouth every 6 (six) hours as needed.    [provider]  loratadine (CLARITIN) 10 MG tablet Take 10 mg by mouth daily.    [provider]  Multiple Vitamin (MULTIVITAMIN WITH MINERALS) TABS tablet Take 1 tablet by mouth daily.    [provider]  ondansetron (ZOFRAN) 4 MG tablet Take 1 tablet (4 mg total) by mouth every 8 (eight) hours as needed for nausea or vomiting. 01/02/18   Shuford, Olivia Mackie, PA-C  oxyCODONE-acetaminophen (PERCOCET) 5-325 MG tablet Take 1 tablet by mouth every 4 (four) hours as needed (max 6 q). 01/02/18   Shuford, Olivia Mackie, PA-C  Allergies    Amoxil [amoxicillin] and Oxycodone  Review of Systems   Review of Systems  Constitutional: Negative for chills, fatigue and fever.  HENT: Positive for facial swelling, nosebleeds and sore throat. Negative for trouble swallowing and voice change.   Respiratory: Negative for cough and shortness of breath.   Cardiovascular: Negative for chest pain.  Gastrointestinal: Negative for abdominal pain, diarrhea, nausea and vomiting.  Skin: Positive for rash.  Neurological: Negative for weakness and headaches.  All other systems reviewed and are negative.   Physical Exam Updated Vital Signs BP 118/75 (BP Location: Left Arm)   Pulse (!) 58   Temp 97.9 F (36.6 C) (Oral)    Resp 12   Ht 6\' 4"  (1.93 m)   Wt 88.5 kg   SpO2 98%   BMI 23.74 kg/m   Physical Exam Vitals and nursing note reviewed.  Constitutional:      General: He is not in acute distress.    Appearance: He is not diaphoretic.  HENT:     Head:     Comments: Edema of the lower lip is symmetric.  Normal phonation.  There is gauze held in place as postoperative dressing.  Mild bilateral eye edema. Eyes:     General: No scleral icterus.       Right eye: No discharge.        Left eye: No discharge.     Conjunctiva/sclera: Conjunctivae normal.  Cardiovascular:     Rate and Rhythm: Normal rate and regular rhythm.     Heart sounds: Normal heart sounds.  Pulmonary:     Effort: Pulmonary effort is normal. No respiratory distress.     Breath sounds: Normal breath sounds. No stridor. No wheezing.  Abdominal:     General: There is no distension.  Musculoskeletal:        General: No deformity.     Cervical back: Normal range of motion and neck supple.  Skin:    General: Skin is warm and dry.     Comments: Scant urticaria rash on chest  Neurological:     Mental Status: He is alert.     Motor: No abnormal muscle tone.  Psychiatric:        Mood and Affect: Mood normal.        Behavior: Behavior normal.     ED Results / Procedures / Treatments   Labs (all labs ordered are listed, but only abnormal results are displayed) Labs Reviewed - No data to display  EKG None  Radiology No results found.  Procedures Procedures   Medications Ordered in ED Medications  methylPREDNISolone sodium succinate (SOLU-MEDROL) 125 mg/2 mL injection 125 mg (125 mg Intravenous Given 05/05/20 1648)  famotidine (PEPCID) IVPB 20 mg premix (0 mg Intravenous Stopped 05/05/20 1739)  acetaminophen (TYLENOL) tablet 650 mg (650 mg Oral Given 05/05/20 1809)    ED Course  I have reviewed the triage vital signs and the nursing notes.  Pertinent labs & imaging results that were available during my care of the patient  were reviewed by me and considered in my medical decision making (see chart for details).  Clinical Course as of 05/05/20 2002  Thu May 05, 2020  1615 I spoke with Dr. Benjamine Mola with ENT who performed patient's surgery earlier today.  He states that patient should stop taking the pain medicine and antibiotic as that is purely prophylactic.  He states he does not need to be on a different antibiotic.  He states that patient should  take Tylenol for pain as needed.  He states that patient should not take ibuprofen or NSAIDs due to increased bleeding. [EH]  1739 I patient evaluated, he still has some swelling of his lower lip.  He reports his nose is hurting however otherwise he feels fine. [EH]    Clinical Course User Index [EH] Ollen Gross   MDM Rules/Calculators/A&P                          Patient is a 30 year old man who presents today for evaluation of anaphylactic reaction.  He took p.o. Benadryl and was given an EpiPen prior to arrival.  He was observed in the emergency room for 3 hours to complete a total of approximately 4 hours since his EpiPen.  He did have sinus surgery today. It is unclear what exactly he reacted to his he took both reportedly amoxicillin and oxycodone at the same time.  It is unclear if this was a reaction to 1 of those medications or a delayed reaction from a different medicine from his surgery.  He did take Augmentin within the past few months without significant difficulty.  Given that he had surgery earlier today I talked with Dr. Benjamine Mola who recommended the patient stop taking antibiotics.  I asked him if he needed a different antibiotic and he stated the patient did not need antibiotics that they are prophylactic.  He also recommend the patient stop taking the prescription pain medicine, instead take Tylenol, not NSAIDs as needed for pain.  Patient did have mild lower lip edema, this improved while in the emergency room.  Return precautions were discussed  with patient who states their understanding.  At the time of discharge patient denied any unaddressed complaints or concerns.  Patient is agreeable for discharge home.  Note: Portions of this report may have been transcribed using voice recognition software. Every effort was made to ensure accuracy; however, inadvertent computerized transcription errors may be present Final Clinical Impression(s) / ED Diagnoses Final diagnoses:  Anaphylaxis, initial encounter    Rx / DC Orders ED Discharge Orders         Ordered    EPINEPHrine 0.3 mg/0.3 mL IJ SOAJ injection  As needed        05/05/20 1751    predniSONE (DELTASONE) 20 MG tablet  Daily        05/05/20 1751           Ollen Gross 05/05/20 Arvin Collard, MD 05/05/20 2044

## 2022-02-20 ENCOUNTER — Emergency Department (HOSPITAL_COMMUNITY): Payer: 59

## 2022-02-20 ENCOUNTER — Emergency Department (HOSPITAL_COMMUNITY)
Admission: EM | Admit: 2022-02-20 | Discharge: 2022-02-21 | Discharge status: Against Medical Advice | Payer: 59 | Attending: Medical | Admitting: Medical

## 2022-02-20 ENCOUNTER — Encounter (HOSPITAL_COMMUNITY): Payer: Self-pay

## 2022-02-20 DIAGNOSIS — R109 Unspecified abdominal pain: Secondary | ICD-10-CM | POA: Insufficient documentation

## 2022-02-20 DIAGNOSIS — R0602 Shortness of breath: Secondary | ICD-10-CM | POA: Insufficient documentation

## 2022-02-20 DIAGNOSIS — Y92009 Unspecified place in unspecified non-institutional (private) residence as the place of occurrence of the external cause: Secondary | ICD-10-CM | POA: Insufficient documentation

## 2022-02-20 DIAGNOSIS — W1789XA Other fall from one level to another, initial encounter: Secondary | ICD-10-CM | POA: Diagnosis not present

## 2022-02-20 LAB — COMPREHENSIVE METABOLIC PANEL
ALT: 29 U/L (ref 0–44)
AST: 36 U/L (ref 15–41)
Albumin: 4.7 g/dL (ref 3.5–5.0)
Alkaline Phosphatase: 49 U/L (ref 38–126)
Anion gap: 10 (ref 5–15)
BUN: 13 mg/dL (ref 6–20)
CO2: 27 mmol/L (ref 22–32)
Calcium: 9.6 mg/dL (ref 8.9–10.3)
Chloride: 104 mmol/L (ref 98–111)
Creatinine, Ser: 1.12 mg/dL (ref 0.61–1.24)
GFR, Estimated: >60 mL/min (ref >60)
Glucose, Bld: 132 mg/dL — ABNORMAL HIGH (ref 70–99)
Potassium: 4.1 mmol/L (ref 3.5–5.1)
Sodium: 141 mmol/L (ref 135–145)
Total Bilirubin: 0.6 mg/dL (ref 0.3–1.2)
Total Protein: 7.7 g/dL (ref 6.5–8.1)

## 2022-02-20 LAB — CBC WITH DIFFERENTIAL/PLATELET
Abs Immature Granulocytes: 0.07 10*3/uL (ref 0.00–0.07)
Basophils Absolute: 0 10*3/uL (ref 0.0–0.1)
Basophils Relative: 0 %
Eosinophils Absolute: 0.1 10*3/uL (ref 0.0–0.5)
Eosinophils Relative: 1 %
HCT: 43.6 % (ref 39.0–52.0)
Hemoglobin: 14.8 g/dL (ref 13.0–17.0)
Immature Granulocytes: 1 %
Lymphocytes Relative: 35 %
Lymphs Abs: 2.9 10*3/uL (ref 0.7–4.0)
MCH: 30.1 pg (ref 26.0–34.0)
MCHC: 33.9 g/dL (ref 30.0–36.0)
MCV: 88.8 fL (ref 80.0–100.0)
Monocytes Absolute: 0.4 10*3/uL (ref 0.1–1.0)
Monocytes Relative: 5 %
Neutro Abs: 4.8 10*3/uL (ref 1.7–7.7)
Neutrophils Relative %: 58 %
Platelets: 362 10*3/uL (ref 150–400)
RBC: 4.91 MIL/uL (ref 4.22–5.81)
RDW: 12.5 % (ref 11.5–15.5)
WBC: 8.3 10*3/uL (ref 4.0–10.5)
nRBC: 0 % (ref 0.0–0.2)

## 2022-02-20 MED ORDER — HYDROCODONE-ACETAMINOPHEN 5-325 MG PO TABS: 1.0000 | ORAL_TABLET | Freq: Once | ORAL | Status: DC

## 2022-02-20 MED ORDER — TETANUS-DIPHTH-ACELL PERTUSSIS 5-2.5-18.5 LF-MCG/0.5 IM SUSY: 0.5000 mL | PREFILLED_SYRINGE | Freq: Once | INTRAMUSCULAR | Status: DC

## 2022-02-20 MED ORDER — IOHEXOL 350 MG/ML SOLN
75.0000 mL | Freq: Once | INTRAVENOUS | Status: AC | PRN
  Administered 2022-02-20: 75 mL via INTRAVENOUS

## 2022-02-20 NOTE — ED Triage Notes (Signed)
Pt states he was working in his attic when he stepped through the ceiling and fell. Hit R side of his ribs on ceiling joist and caught himself with his L arm on joist, able to lower himself down without falling the rest of the way down. Pt c/o SOB on deep inspiration. No head injury/LOC.

## 2022-02-20 NOTE — ED Notes (Signed)
During last vital signs update, the patient stated that he wanted to know if he could have something for pain. This EMT observes that the pain appears to be in discomfort due to injuries.

## 2022-02-20 NOTE — ED Provider Triage Note (Signed)
Emergency Medicine Provider Triage Evaluation Note  Cody Callahan , a 31 y.o. male  was evaluated in triage.  Pt complains of falling through the ceiling at home. Fell 8 feet. Hit ribs on ceiling joist. No head pain, neck pain. C/o sob, difficulty breathing.   Review of Systems  Positive: Abdominal pain, chest pain Negative: Neck pain, headache  Physical Exam  BP 122/85 (BP Location: Right Arm)   Pulse 70   Temp 97.8 F (36.6 C) (Oral)   Resp 16   SpO2 99%  Gen:   Awake, no distress   Resp:  Normal effort  MSK:   Moves extremities without difficulty  Other:  +TTP of R flank, abrasions noted  Medical Decision Making  Medically screening exam initiated at 4:23 PM.  Appropriate orders placed.  Cody Callahan was informed that the remainder of the evaluation will be completed by another provider, this initial triage assessment does not replace that evaluation, and the importance of remaining in the ED until their evaluation is complete.    Osvaldo Shipper, [place] 02/20/22 1626

## 2022-02-21 NOTE — ED Notes (Signed)
Pt states they are leaving, IV removed.  

## 2022-03-26 ENCOUNTER — Ambulatory Visit
Admission: RE | Admit: 2022-03-26 | Discharge: 2022-03-26 | Disposition: A | Discharge status: Home / Self Care | Payer: 59 | Source: Ambulatory Visit | Attending: Family Medicine | Admitting: Family Medicine

## 2022-03-26 ENCOUNTER — Other Ambulatory Visit: Payer: Self-pay | Admitting: Family Medicine

## 2022-03-26 DIAGNOSIS — S2231XD Fracture of one rib, right side, subsequent encounter for fracture with routine healing: Secondary | ICD-10-CM

## 2023-08-14 ENCOUNTER — Encounter: Payer: Self-pay | Admitting: Cardiology

## 2023-08-14 ENCOUNTER — Ambulatory Visit: Attending: Cardiology | Admitting: Cardiology

## 2023-08-14 ENCOUNTER — Other Ambulatory Visit: Payer: Self-pay

## 2023-08-14 VITALS — BP 120/72 | HR 93 | Ht 76.0 in | Wt 220.8 lb

## 2023-08-14 DIAGNOSIS — Z136 Encounter for screening for cardiovascular disorders: Secondary | ICD-10-CM

## 2023-08-14 DIAGNOSIS — Z8249 Family history of ischemic heart disease and other diseases of the circulatory system: Secondary | ICD-10-CM | POA: Diagnosis not present

## 2023-08-14 NOTE — Patient Instructions (Signed)
 Medication Instructions:  Your physician recommends that you continue on your current medications as directed. Please refer to the Current Medication list given to you today.  *If you need a refill on your cardiac medications before your next appointment, please call your pharmacy*  Lab Work: Fasting lipid panel, ALT  If you have labs (blood work) drawn today and your tests are completely normal, you will receive your results only by: MyChart Message (if you have MyChart) OR A paper copy in the mail If you have any lab test that is abnormal or we need to change your treatment, we will call you to review the results.  Testing/Procedures: Echo Your physician has requested that you have an echocardiogram. Echocardiography is a painless test that uses sound waves to create images of your heart. It provides your doctor with information about the size and shape of your heart and how well your heart's chambers and valves are working. This procedure takes approximately one hour. There are no restrictions for this procedure. Please do NOT wear cologne, perfume, aftershave, or lotions (deodorant is allowed). Please arrive 15 minutes prior to your appointment time.  Please note: We ask at that you not bring children with you during ultrasound (echo/ vascular) testing. Due to room size and safety concerns, children are not allowed in the ultrasound rooms during exams. Our front office staff cannot provide observation of children in our lobby area while testing is being conducted. An adult accompanying a patient to their appointment will only be allowed in the ultrasound room at the discretion of the ultrasound technician under special circumstances. We apologize for any inconvenience.   Exercise Tolerance Test  **No food or drink for 3 hours prior **No caffeine or decaf or chocolate for 12 hours prior **Wear comfortable clothing and shoes  Coronary Calcium Score Your physician has requested that you  have a coronary calcium score performed. This is not covered by insurance and will be an out-of-pocket cost of approximately $99.   Follow-Up: At [facility], you and your health needs are our priority.  As part of our continuing mission to provide you with exceptional heart care, our providers are all part of one team.  This team includes your primary Cardiologist (physician) and Advanced Practice Providers or APPs (Physician Assistants and Nurse Practitioners) who all work together to provide you with the care you need, when you need it.  Your next appointment:   1 year(s)  Provider:   Micael Adas, MD  We recommend signing up for the patient portal called "MyChart".  Sign up information is provided on this After Visit Summary.  MyChart is used to connect with patients for Virtual Visits (Telemedicine).  Patients are able to view lab/test results, encounter notes, upcoming appointments, etc.  Non-urgent messages can be sent to your provider as well.   To learn more about what you can do with MyChart, go to [email or web address].   Other Instructions

## 2023-08-14 NOTE — Progress Notes (Addendum)
 Cardiology CONSULT Note    Date:  08/14/2023   ID:  [name], DOB 10/11/1990, MRN [id number]  PCP:  Patient, No Pcp Per  Cardiologist:  Gaylyn Keas, MD   Chief Complaint  Patient presents with   New Patient (Initial Visit)    Family history of premature CAD    Patient Profile: Cody Callahan is a 33 y.o. male who is being seen today for the evaluation of family history of premature CAD at the request of [name], MD.  History of Present Illness:  Cody Callahan is a 33 y.o. male who is being seen today for the evaluation of family history of premature CAD at the request of [name], MD.   This is a 33 year old male with no prior cardiac history but does have a family history of early CAD and is now referred for cardiac evaluation. His father was dx with CAD in his 15's and mom at 9.  He has a sister that was just dx with CAD with CABG at age 12.  His PGF died at 105 of MI and had CAD in his late teens. Most of his father's side of the family have not lived past the age of 15.  He occasionally smokes 2 pack per week.  He denies any chest pain or pressure, SOB, DOE, dizziness, syncope, palpitations or LE edema.   Past Medical History:  Diagnosis Date   Broken arm    Clavicle fracture    left   Environmental and seasonal allergies    Gonorrhea contact, treated     Past Surgical History:  Procedure Laterality Date   FRACTURE SURGERY     RUE   ORIF CLAVICULAR FRACTURE Left 01/02/2018   Procedure: OPEN REDUCTION INTERNAL FIXATION (ORIF) left clavicle fracture;  Surgeon: Ellard Gunning, MD;  Location: [facility] OR;  Service: Orthopedics;  Laterality: Left;     Current Medications: Current Meds  Medication Sig   loratadine (CLARITIN) 10 MG tablet Take 10 mg by mouth daily.   Multiple Vitamin (MULTIVITAMIN WITH MINERALS) TABS tablet Take 1 tablet by mouth daily.    Allergies:   Amoxil  [amoxicillin ] and Oxycodone    Social History    Socioeconomic History   Marital status: Single    Spouse name: Not on file   Number of children: Not on file   Years of education: Not on file   Highest education level: Not on file  Occupational History   Not on file  Tobacco Use   Smoking status: Former    Types: Pipe, Cigars   Smokeless tobacco: Never  Vaping Use   Vaping status: Never Used  Substance and Sexual Activity   Alcohol use: Yes    Comment: social   Drug use: No   Sexual activity: Not on file  Other Topics Concern   Not on file  Social History Narrative   Lives with mother and sister.  Currently employed as part time Airline pilot at [employer].  Sexually acted, uses condoms every time but does have hx of GC/Chlamydia   Social Drivers of Corporate investment banker Strain: Not on file  Food Insecurity: Not on file  Transportation Needs: Not on file  Physical Activity: Not on file  Stress: Not on file  Social Connections: Not on file     Family History:  The patient's family history includes Breast cancer (age of onset: 37) in his mother; Coronary artery disease (age of onset: 32) in his  sister; Crohn's disease (age of onset: 36) in his sister; Diabetes (age of onset: 32) in his mother; Heart attack (age of onset: 3) in his father; Heart disease in his father; Heart disease (age of onset: 61) in his mother.   ROS:   Please see the history of present illness.    ROS All other systems reviewed and are negative.      No data to display             PHYSICAL EXAM:   VS:  BP 120/72   Pulse 93   Ht 6\' 4"  (1.93 m)   Wt 220 lb 12.8 oz (100.2 kg)   BMI 26.88 kg/m    GEN: Well nourished, well developed, in no acute distress  HEENT: normal  Neck: no JVD, carotid bruits, or masses Cardiac: RRR; no murmurs, rubs, or gallops,no edema.  Intact distal pulses bilaterally.  Respiratory:  clear to auscultation bilaterally, normal work of breathing GI: soft, nontender, nondistended, + BS MS: no deformity or atrophy   Skin: warm and dry, no rash Neuro:  Alert and Oriented x 3, Strength and sensation are intact Psych: euthymic mood, full affect  Wt Readings from Last 3 Encounters:  08/14/23 220 lb 12.8 oz (100.2 kg)  05/05/20 195 lb (88.5 kg)  01/02/18 190 lb (86.2 kg)      Studies/Labs Reviewed:    Recent Labs: No results found for requested labs within last 365 days.   Lipid Panel No results found for: "CHOL", "TRIG", "HDL", "CHOLHDL", "VLDL", "LDLCALC", "LDLDIRECT"    Additional studies/ records that were reviewed today include:  none    ASSESSMENT:    1. Screening for cardiovascular condition   2. Family history of premature CAD      PLAN:  In order of problems listed above:  #Family history of premature CAD -His mother had dx of CAD at 83, Dad had an MI at 68, PGF died at 37 of MI and had CAD earlier, sister just had CABG at 48 -he is asymptomatic from a cardiac standpoint -will get a coronary Ca score to assess future cardiac risk -check GXT to rule out ischemia -Informed Consent   Shared Decision Making/Informed Consent The risks [chest pain, shortness of breath, cardiac arrhythmias, dizziness, blood pressure fluctuations, myocardial infarction, stroke/transient ischemic attack, and life-threatening complications (estimated to be 1 in 10,000)], benefits (risk stratification, diagnosing coronary artery disease, treatment guidance) and alternatives of an exercise tolerance test were discussed in detail with [name] and he agrees to proceed.    -check 2D echo to assess LVF -his TAGs were elevated on lipids on testing 06/2023>>repeat FLP and ALT   Time Spent: 20 minutes total time of encounter, including 15 minutes spent in face-to-face patient care on the date of this encounter. This time includes coordination of care and counseling regarding above mentioned problem list. Remainder of non-face-to-face time involved reviewing chart documents/testing relevant to the patient  encounter and documentation in the medical record. I have independently reviewed documentation from referring provider  Followup:  PRN  Medication Adjustments/Labs and Tests Ordered: Current medicines are reviewed at length with the patient today.  Concerns regarding medicines are outlined above.  Medication changes, Labs and Tests ordered today are listed in the Patient Instructions below.  There are no Patient Instructions on file for this visit.   Signed, Gaylyn Keas, MD  08/14/2023 1:15 PM    [facility] [address] [place], [place], [place]  [address] Phone: [phone]; Fax: [phone]

## 2023-08-14 NOTE — Addendum Note (Signed)
 Addended by: Marriah Sanderlin N on: 08/14/2023 01:37 PM   Modules accepted: Orders

## 2023-09-04 ENCOUNTER — Ambulatory Visit (HOSPITAL_BASED_OUTPATIENT_CLINIC_OR_DEPARTMENT_OTHER)
Admission: RE | Admit: 2023-09-04 | Discharge: 2023-09-04 | Disposition: A | Discharge status: Home / Self Care | Payer: Self-pay | Source: Ambulatory Visit | Attending: Cardiology | Admitting: Cardiology

## 2023-09-04 DIAGNOSIS — Z136 Encounter for screening for cardiovascular disorders: Secondary | ICD-10-CM | POA: Insufficient documentation

## 2023-09-04 DIAGNOSIS — Z8249 Family history of ischemic heart disease and other diseases of the circulatory system: Secondary | ICD-10-CM | POA: Insufficient documentation

## 2023-09-06 ENCOUNTER — Ambulatory Visit: Payer: Self-pay | Admitting: Cardiology

## 2023-09-11 NOTE — Telephone Encounter (Signed)
 Call to advise patient of test results, no DPR on file. Left message with no identifiers asking recipient to call [place] at our office #.

## 2023-09-12 ENCOUNTER — Telehealth (HOSPITAL_COMMUNITY): Payer: Self-pay | Admitting: *Deleted

## 2023-09-12 NOTE — Telephone Encounter (Signed)
 Left a detailed message on cell voice mail regarding his ETT on 09/19/23 at 1:45.

## 2023-09-12 NOTE — Telephone Encounter (Signed)
-----   Message from Gaylyn Keas sent at 09/06/2023  9:36 AM EDT ----- Coronary calcium score 0

## 2023-09-12 NOTE — Telephone Encounter (Signed)
 Call to discuss coronary calcium score CT results. No answer, no DPR on file. Left message with no identifiers asking recipient to call our office. Letter sent.

## 2023-09-18 ENCOUNTER — Other Ambulatory Visit: Payer: Self-pay

## 2023-09-18 DIAGNOSIS — Z8249 Family history of ischemic heart disease and other diseases of the circulatory system: Secondary | ICD-10-CM

## 2023-09-18 DIAGNOSIS — Z136 Encounter for screening for cardiovascular disorders: Secondary | ICD-10-CM

## 2023-09-19 ENCOUNTER — Ambulatory Visit (HOSPITAL_COMMUNITY)
Admission: RE | Admit: 2023-09-19 | Discharge: 2023-09-19 | Disposition: A | Discharge status: Home / Self Care | Source: Ambulatory Visit | Attending: Cardiology | Admitting: Cardiology

## 2023-09-19 DIAGNOSIS — Z136 Encounter for screening for cardiovascular disorders: Secondary | ICD-10-CM | POA: Insufficient documentation

## 2023-09-19 DIAGNOSIS — Z8249 Family history of ischemic heart disease and other diseases of the circulatory system: Secondary | ICD-10-CM | POA: Insufficient documentation

## 2023-09-19 LAB — ECHOCARDIOGRAM COMPLETE
Area-P 1/2: 3.47 cm2
S' Lateral: 3.5 cm

## 2023-09-19 LAB — EXERCISE TOLERANCE TEST
Angina Index: 0
Duke Treadmill Score: 14
Estimated workload: 17.2
Exercise duration (min): 14 min
Exercise duration (sec): 0 s
MPHR: 188 {beats}/min
Peak HR: 190 {beats}/min
Percent HR: 101 %
RPE: 18
Rest HR: 65 {beats}/min
ST Depression (mm): 0 mm

## 2023-09-20 NOTE — Telephone Encounter (Signed)
 Attempted to call, no answer, no DPR on file, left message with no identifiers asking recipient to call [place] at our #.

## 2023-09-20 NOTE — Telephone Encounter (Signed)
-----   Message from Gaylyn Keas sent at 09/19/2023  2:49 PM EDT ----- Please let patient know that stress test was fine ----- Message ----- From: Jann Melody, MD Sent: 09/19/2023   2:23 PM EDT To: Jacqueline Matsu, MD

## 2023-10-01 ENCOUNTER — Telehealth: Payer: Self-pay | Admitting: Cardiology

## 2023-10-01 DIAGNOSIS — Z79899 Other long term (current) drug therapy: Secondary | ICD-10-CM

## 2023-10-01 DIAGNOSIS — Z8249 Family history of ischemic heart disease and other diseases of the circulatory system: Secondary | ICD-10-CM

## 2023-10-01 DIAGNOSIS — E785 Hyperlipidemia, unspecified: Secondary | ICD-10-CM

## 2023-10-01 NOTE — Telephone Encounter (Signed)
 Spoke with patient and he was calling about lab results.  He is aware provider has not finalized lab results. Once finalized we will give him a call.

## 2023-10-01 NOTE — Telephone Encounter (Signed)
 Patient was returning call. Please advise ?

## 2023-10-03 MED ORDER — ATORVASTATIN CALCIUM 20 MG PO TABS: 20.0000 mg | ORAL_TABLET | Freq: Every day | ORAL | 3 refills | Status: AC

## 2023-10-03 NOTE — Telephone Encounter (Signed)
 [facility] to patient advising Dr. Shlomo reviewed lipid panel and LDL is elevated at 148 with goal less than 100. Dr. Shlomo recommends he start on atorvastatin 20 mg daily and repeat FLP and ALT in 6 weeks. Orders placed.

## 2024-01-08 ENCOUNTER — Telehealth: Admitting: Family Medicine

## 2024-01-08 DIAGNOSIS — B9689 Other specified bacterial agents as the cause of diseases classified elsewhere: Secondary | ICD-10-CM

## 2024-01-08 DIAGNOSIS — J019 Acute sinusitis, unspecified: Secondary | ICD-10-CM

## 2024-01-08 MED ORDER — AZELASTINE HCL 0.1 % NA SOLN: 2.0000 | Freq: Two times a day (BID) | NASAL | 0 refills | Status: AC

## 2024-01-08 MED ORDER — DOXYCYCLINE HYCLATE 100 MG PO TABS: 100.0000 mg | ORAL_TABLET | Freq: Two times a day (BID) | ORAL | 0 refills | Status: AC

## 2024-01-08 NOTE — Progress Notes (Signed)
 E-Visit for Sinus Problems  We are sorry that you are not feeling well.  Here is how we plan to help!  Based on what you have shared with me it looks like you have sinusitis.  Sinusitis is inflammation and infection in the sinus cavities of the head.  Based on your presentation I believe you most likely have Acute Bacterial Sinusitis.  This is an infection caused by bacteria and is treated with antibiotics. I have prescribed Doxycycline 100mg  by mouth twice a day for 7 days. and I have also prescribed Azelastine Nasal Spray Use 1 spray in each nostril twice daily for 10-14 days You may use an oral decongestant such as Mucinex D or if you have glaucoma or high blood pressure use plain Mucinex. Saline nasal spray help and can safely be used as often as needed for congestion.  If you develop worsening sinus pain, fever or notice severe headache and vision changes, or if symptoms are not better after completion of antibiotic, please schedule an appointment with a health care provider.    Sinus infections are not as easily transmitted as other respiratory infection, however we still recommend that you avoid close contact with loved ones, especially the very young and elderly.  Remember to wash your hands thoroughly throughout the day as this is the number one way to prevent the spread of infection!  Home Care: Only take medications as instructed by your medical team. Complete the entire course of an antibiotic. Do not take these medications with alcohol. A steam or ultrasonic humidifier can help congestion.  You can place a towel over your head and breathe in the steam from hot water coming from a faucet. Avoid close contacts especially the very young and the elderly. Cover your mouth when you cough or sneeze. Always remember to wash your hands.  Get Help Right Away If: You develop worsening fever or sinus pain. You develop a severe head ache or visual changes. Your symptoms persist after you have  completed your treatment plan.  Make sure you Understand these instructions. Will watch your condition. Will get help right away if you are not doing well or get worse.  Your e-visit answers were reviewed by a board certified advanced clinical practitioner to complete your personal care plan.  Depending on the condition, your plan could have included both over the counter or prescription medications.  If there is a problem please reply  once you have received a response from your provider.  Your safety is important to us .  If you have drug allergies check your prescription carefully.    You can use MyChart to ask questions about today's visit, request a non-urgent call back, or ask for a work or school excuse for 24 hours related to this e-Visit. If it has been greater than 24 hours you will need to follow up with your provider, or enter a new e-Visit to address those concerns.  You will get an e-mail in the next two days asking about your experience.  I hope that your e-visit has been valuable and will speed your recovery. Thank you for using e-visits.  I have spent 5 minutes in review of e-visit questionnaire, review and updating patient chart, medical decision making and response to patient.   Chiquita CHRISTELLA Barefoot, NP
